# Patient Record
Sex: Female | Born: 1990 | Race: Black or African American | Hispanic: No | Marital: Married | Smoking: Never smoker
Health system: Southern US, Community
[De-identification: ages and names within clinical notes are randomized; demographics above are authoritative.]

## PROBLEM LIST (undated history)

## (undated) DIAGNOSIS — D649 Anemia, unspecified: Secondary | ICD-10-CM

## (undated) HISTORY — PX: NO PAST SURGERIES: SHX2092

---

## 2017-10-10 ENCOUNTER — Ambulatory Visit (INDEPENDENT_AMBULATORY_CARE_PROVIDER_SITE_OTHER): Payer: Medicaid Other

## 2017-10-10 ENCOUNTER — Ambulatory Visit (HOSPITAL_COMMUNITY)
Admission: EM | Admit: 2017-10-10 | Discharge: 2017-10-10 | Disposition: A | Discharge status: Home / Self Care | Payer: Medicaid Other | Attending: Family Medicine | Admitting: Family Medicine

## 2017-10-10 ENCOUNTER — Encounter (HOSPITAL_COMMUNITY): Payer: Self-pay | Admitting: Family Medicine

## 2017-10-10 DIAGNOSIS — T148XXA Other injury of unspecified body region, initial encounter: Secondary | ICD-10-CM | POA: Diagnosis not present

## 2017-10-10 DIAGNOSIS — S50811A Abrasion of right forearm, initial encounter: Secondary | ICD-10-CM

## 2017-10-10 DIAGNOSIS — S80211A Abrasion, right knee, initial encounter: Secondary | ICD-10-CM

## 2017-10-10 DIAGNOSIS — M25521 Pain in right elbow: Secondary | ICD-10-CM | POA: Diagnosis not present

## 2017-10-10 DIAGNOSIS — M25561 Pain in right knee: Secondary | ICD-10-CM | POA: Diagnosis not present
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
## 2017-10-10 NOTE — Discharge Instructions (Signed)
Continue with triple antibiotic ointment twice a day. Cleanse abrasions with soap and water twice a day. Keep clean and dry. Ice application for comfort. Ibuprofen for comfort. If develop increased redness or increased drainage follow up with your Primary Care provider to be rechecked.
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
## 2017-11-12 ENCOUNTER — Inpatient Hospital Stay (HOSPITAL_COMMUNITY)
Admission: AD | Admit: 2017-11-12 | Discharge: 2017-11-12 | Disposition: A | Discharge status: Home / Self Care | Payer: Medicaid Other | Source: Ambulatory Visit | Attending: Obstetrics & Gynecology | Admitting: Obstetrics & Gynecology

## 2017-11-12 ENCOUNTER — Encounter (HOSPITAL_COMMUNITY): Payer: Self-pay

## 2017-11-12 DIAGNOSIS — Z3202 Encounter for pregnancy test, result negative: Secondary | ICD-10-CM | POA: Diagnosis not present

## 2017-11-12 DIAGNOSIS — R109 Unspecified abdominal pain: Secondary | ICD-10-CM | POA: Diagnosis present

## 2017-11-12 HISTORY — DX: Anemia, unspecified: D64.9

## 2017-11-12 LAB — POCT PREGNANCY, URINE: Preg Test, Ur: NEGATIVE

## 2017-11-12 LAB — WET PREP, GENITAL
Trich, Wet Prep: NONE SEEN
YEAST WET PREP: NONE SEEN

## 2017-11-12 LAB — URINALYSIS, ROUTINE W REFLEX MICROSCOPIC
BILIRUBIN URINE: NEGATIVE
Glucose, UA: NEGATIVE mg/dL
Hgb urine dipstick: NEGATIVE
Ketones, ur: NEGATIVE mg/dL
NITRITE: NEGATIVE
PH: 7 (ref 5.0–8.0)
Protein, ur: NEGATIVE mg/dL
SPECIFIC GRAVITY, URINE: 1.024 (ref 1.005–1.030)

## 2017-11-12 LAB — CBC
HCT: 36.8 % (ref 36.0–46.0)
HEMOGLOBIN: 11.6 g/dL — AB (ref 12.0–15.0)
MCH: 26.8 pg (ref 26.0–34.0)
MCHC: 31.5 g/dL (ref 30.0–36.0)
MCV: 85 fL (ref 78.0–100.0)
PLATELETS: 273 10*3/uL (ref 150–400)
RBC: 4.33 MIL/uL (ref 3.87–5.11)
RDW: 13.5 % (ref 11.5–15.5)
WBC: 10.5 10*3/uL (ref 4.0–10.5)

## 2017-11-12 LAB — HCG, SERUM, QUALITATIVE: PREG SERUM: NEGATIVE

## 2017-11-12 NOTE — Discharge Instructions (Signed)
In late 2019, the [facility] will be moving to the [facility] campus. At that time, the MAU (Maternity Admissions Unit), where you are being seen today, will no longer see non-pregnant patients. We strongly encourage you to find a doctor's office before that time, so that you can be seen with any GYN concerns, like vaginal discharge, urinary tract infection, etc.. in a timely manner.   In order to make the office visit more convenient, the [facility] at [facility] will be offering evening hours from 4pm-7:30pm on Monday. There will be same-day appointments, walk-in appointments and scheduled appointments available during this time. We will be adding more evening hours over the next year before the move.   [facility] @ [facility] [phone]  For urgent needs, [facility] is also available for management of urgent GYN complaints such as vaginal discharge or urinary tract infections.      Infertility Infertility is when you are unable to get pregnant (conceive) after a year of having sex regularly without using birth control. Infertility can also mean that a woman is not able to carry a pregnancy to full term. Both women and men can have fertility problems. What causes infertility? What Causes Infertility in Women? There are many possible causes of infertility in women. For some women, the cause of infertility is not known (unexplained infertility). Infertility can also be linked to more than one cause. Infertility problems in women can be caused by problems with the menstrual cycle or reproductive organs, certain medical conditions, and factors related to lifestyle and age.  Problems with your menstrual cycle can interfere with your ovaries producing eggs (ovulation). This can make it difficult to get pregnant. This includes having a menstrual cycle that is very long, very short, or irregular.  Problems with reproductive organs  can include: ? An abnormally narrow cervix or a cervix that does not remain closed during a pregnancy. ? A blockage in your fallopian tubes. ? An abnormally shaped uterus. ? Uterine fibroids. This is a tissue mass (tumor) that can develop on your uterus.  Medical conditions that can affect a woman's fertility include: ? Polycystic ovarian syndrome (PCOS). This is a hormonal disorder that can cause small cysts to grow on your ovaries. This is the most common cause of infertility in women. ? Endometriosis. This is a condition in which the tissue that lines your uterus (endometrium) grows outside of its normal location. ? Primary ovary insufficiency. This is when your ovaries stop producing eggs and hormones before the age of 72. ? Sexually transmitted diseases, such as chlamydia or gonorrhea. These infections can cause scarring in your fallopian tubes. This makes it difficult for eggs to reach your uterus. ? Autoimmune disorders. These are disorders in which your immune system attacks normal, healthy cells. ? Hormone imbalances.  Other factors include: ? Age. A woman's fertility declines with age, especially after her mid-30s. ? Being under- or overweight. ? Drinking too much alcohol. ? Using drugs. ? Exercising excessively. ? Being exposed to environmental toxins, such as radiation, pesticides, and certain chemicals.  What Causes Infertility in Men? There are many causes of infertility in men. Infertility can be linked to more than one cause. Infertility problems in men can be caused by problems with sperm or the reproductive organs, certain medical conditions, and factors related to lifestyle and age. Some men have unexplained infertility.  Problems with sperm. Infertility can result if there is a problem producing: ?  Enough sperm (low sperm count). ? Enough normally-shaped sperm (sperm morphology). ? Sperm that are able to reach the egg (poor motility).  Infertility can also be caused  by: ? A problem with hormones. ? Enlarged veins (varicoceles), cysts (spermatoceles), or tumors of the testicles. ? Sexual dysfunction. ? Injury to the testicles. ? A birth defect, such as not having the tubes that carry sperm (vas deferens).  Medical conditions that can affect a man's fertility include: ? Diabetes. ? Cancer treatments, such as chemotherapy or radiation. ? Klinefelter syndrome. This is an inherited genetic disorder. ? Thyroid problems, such as an under- or overactive thyroid. ? Cystic fibrosis. ? Sexually transmitted diseases.  Other factors include: ? Age. A man's fertility declines with age. ? Drinking too much alcohol. ? Using drugs. ? Being exposed to environmental toxins, such as pesticides and lead.  What are the symptoms of infertility? Being unable to get pregnant after one year of having regular sex without using birth control is the only sign of infertility. How is infertility diagnosed? In order to be diagnosed with infertility, both partners will have a physical exam. Both partners will also have an extensive medical and sexual history taken. If there is no obvious reason for infertility, additional tests may be done. What Tests Will Women Have? Women may first have tests to check whether they are ovulating each month. The tests may include:  Blood tests to check hormone levels.  An ultrasound of the ovaries. This looks for possible problems on or in the ovaries.  Taking a small sample of the tissue that lines the uterus for examination under a microscope (endometrial biopsy).  Women who are ovulating may have additional tests. These may include:  Hysterosalpingography. ? This is an X-ray of the fallopian tubes and uterus taken after a specific type of dye is injected. ? This test can show the shape of the uterus and whether the fallopian tubes are open.  Laparoscopy. ? In this test, a lighted tube (laparoscope) is used to look for problems in the  fallopian tubes and other female organs.  Transvaginal ultrasound. ? This is an imaging test to check for abnormalities of the uterus and ovaries. ? A health care provider can use this test to count the number of follicles on the ovaries.  Hysteroscopy. ? This test involves using a lighted tube to examine the cervix and inside the uterus. ? It is done to find any abnormalities inside the uterus.  What Tests Will Men Have? Tests for mens infertility includes:  Semen tests to check sperm count, morphology, and motility.  Blood tests to check for hormone levels.  Taking a small sample of tissue from inside a testicle (biopsy). This is examined under a microscope.  Blood tests to check for genetic abnormalities (genetic testing).  How are women treated for infertility? Treatment depends on the cause of infertility. Most cases of infertility in women are treated with medicine or surgery.  Women may take medicine to: ? Correct ovulation problems. ? Treat other health conditions, such as PCOS.  Surgery may be done to: ? Repair damage to the ovaries, fallopian tubes, cervix, or uterus. ? Remove growths from the uterus. ? Remove scar tissue from the uterus, pelvis, or other female organs.  How are men treated for infertility? Treatment depends on the cause of infertility. Most cases of infertility in men are treated with medicine or surgery.  Men may take medicine to: ? Correct hormone problems. ? Treat other health conditions. ?  Treat sexual dysfunction.  Surgery may be done to: ? Remove blockages in the reproductive tract. ? Correct other structural problems of the reproductive tract.  What is assisted reproductive technology? Assisted reproductive technology (ART) refers to all treatments and procedures that combine eggs and sperm outside the body to try to help a couple conceive. ART is often combined with fertility drugs to stimulate ovulation. Sometimes ART is done using  eggs retrieved from another woman's body (donor eggs) or from previously frozen fertilized eggs (embryos). There are different types of ART. These include:  Intrauterine insemination (IUI). ? In this procedure, sperm is placed directly into a woman's uterus with a long, thin tube. ? This may be most effective for infertility caused by sperm problems, including low sperm count and low motility. ? Can be used in combination with fertility drugs.  In vitro fertilization (IVF). ? This is often done when a woman's fallopian tubes are blocked or when a man has low sperm counts. ? Fertility drugs stimulate the ovaries to produce multiple eggs. Once mature, these eggs are removed from the body and combined with the sperm to be fertilized. ? These fertilized eggs are then placed in the woman's uterus.  This information is not intended to replace advice given to you by your health care provider. Make sure you discuss any questions you have with your health care provider. Document Released: 12/14/2003 Document Revised: 05/12/2016 Document Reviewed: 08/26/2014 Elsevier Interactive Patient Education  2018 [employer]

## 2017-11-12 NOTE — MAU Note (Signed)
Pt said she had a HPT that's positive, is having abdominal cramping for the last few days but worse today. 5/10. No bleeding

## 2017-11-12 NOTE — MAU Provider Note (Signed)
History     CSN: [id number]  Arrival date and time: 11/12/17 [id number]   First Provider Initiated Contact with Patient 11/12/17 1812      Chief Complaint  Patient presents with  . Abdominal Pain   26 y.o. female here with cramping. Cramping is bilateral lower abdomen. Rates 5/10. Has not used anything for it. LMP was 13 days ago. She reports having a +HPT 2 days ago, she took it because she was eating and sleeping alot. She also reports having an US at the pregnancy care center last week and was told she had a 5 wk pregnancy in her uterus.   Past Medical History:  Diagnosis Date  . Anemia     Past Surgical History:  Procedure Laterality Date  . NO PAST SURGERIES      History reviewed. No pertinent family history.  Social History   Tobacco Use  . Smoking status: Never Smoker  . Smokeless tobacco: Never Used  Substance Use Topics  . Alcohol use: No    Frequency: Never  . Drug use: No    Allergies: No Known Allergies  No medications prior to admission.    Review of Systems  Gastrointestinal: Positive for abdominal pain. Negative for constipation, diarrhea, nausea and vomiting. Abdominal distention: cramping.  Genitourinary: Negative for dysuria, vaginal bleeding and vaginal discharge.   Physical Exam   Blood pressure 125/67, pulse (!) 116, temperature 98.3 F (36.8 C), temperature source Oral, resp. rate 16, last menstrual period 10/30/2017, SpO2 97 %.  Physical Exam  Constitutional: She is oriented to person, place, and time. She appears well-developed and well-nourished.  HENT:  Head: Normocephalic.  Neck: Normal range of motion.  Respiratory: Effort normal. No respiratory distress.  Genitourinary:  Genitourinary Comments: External: no lesions or erythema Vagina: rugated, pink, moist, small thin white discharge Uterus: non enlarged, anteverted, non tender, no CMT Adnexae: no masses, no tenderness left, no tenderness right   Musculoskeletal: Normal range of  motion.  Neurological: She is alert and oriented to person, place, and time.  Skin: Skin is warm and dry.  Psychiatric: She has a normal mood and affect.   Results for orders placed or performed during the hospital encounter of 11/12/17 (from the past 24 hour(s))  Urinalysis, Routine w reflex microscopic     Status: Abnormal   Collection Time: 11/12/17  5:58 PM  Result Value Ref Range   Color, Urine YELLOW YELLOW   APPearance HAZY (A) CLEAR   Specific Gravity, Urine 1.024 1.005 - 1.030   pH 7.0 5.0 - 8.0   Glucose, UA NEGATIVE NEGATIVE mg/dL   Hgb urine dipstick NEGATIVE NEGATIVE   Bilirubin Urine NEGATIVE NEGATIVE   Ketones, ur NEGATIVE NEGATIVE mg/dL   Protein, ur NEGATIVE NEGATIVE mg/dL   Nitrite NEGATIVE NEGATIVE   Leukocytes, UA TRACE (A) NEGATIVE   RBC / HPF 0-5 0 - 5 RBC/hpf   WBC, UA 6-30 0 - 5 WBC/hpf   Bacteria, UA RARE (A) NONE SEEN   Squamous Epithelial / LPF 0-5 (A) NONE SEEN   Mucus PRESENT   Pregnancy, urine POC     Status: None   Collection Time: 11/12/17  6:01 PM  Result Value Ref Range   Preg Test, Ur NEGATIVE NEGATIVE  Wet prep, genital     Status: Abnormal   Collection Time: 11/12/17  6:22 PM  Result Value Ref Range   Yeast Wet Prep HPF POC NONE SEEN NONE SEEN   Trich, Wet Prep NONE SEEN NONE SEEN  Clue Cells Wet Prep HPF POC PRESENT (A) NONE SEEN   WBC, Wet Prep HPF POC FEW (A) NONE SEEN   Sperm PRESENT   CBC     Status: Abnormal   Collection Time: 11/12/17  6:36 PM  Result Value Ref Range   WBC 10.5 4.0 - 10.5 K/uL   RBC 4.33 3.87 - 5.11 MIL/uL   Hemoglobin 11.6 (L) 12.0 - 15.0 g/dL   HCT 16.136.8 09.636.0 - 04.546.0 %   MCV 85.0 78.0 - 100.0 fL   MCH 26.8 26.0 - 34.0 pg   MCHC 31.5 30.0 - 36.0 g/dL   RDW 40.913.5 81.111.5 - 91.415.5 %   Platelets 273 150 - 400 K/uL  hCG, serum, qualitative     Status: None   Collection Time: 11/12/17  6:36 PM  Result Value Ref Range   Preg, Serum NEGATIVE NEGATIVE   MAU Course  Procedures  MDM Labs ordered and reviewed. No  evidence of pregnancy. No evidence of acute abdominal or pelvic infection. Pt reports trying to conceive with the same partner over the last 4 years and been unsuccessful. I recommend she establish care with a GYN for further evaluation and I provided a list. Stable for discharge home.  Assessment and Plan   1. Abdominal cramping   2. Pregnancy examination or test, negative result    Discharge home Follow up with OBGYN provider of choice Return to MAU for OBGYN emergencies  Allergies as of 11/12/2017   No Known Allergies     Medication List    You have not been prescribed any medications.    Donette LarryMelanie Brynlie Daza, CNM 11/12/2017, 7:19 PM

## 2017-11-13 LAB — GC/CHLAMYDIA PROBE AMP (~~LOC~~) NOT AT ARMC
CHLAMYDIA, DNA PROBE: NEGATIVE
NEISSERIA GONORRHEA: NEGATIVE

## 2017-11-29 ENCOUNTER — Encounter: Payer: Self-pay | Admitting: Obstetrics and Gynecology

## 2017-11-29 ENCOUNTER — Other Ambulatory Visit (HOSPITAL_COMMUNITY)
Admission: RE | Admit: 2017-11-29 | Discharge: 2017-11-29 | Disposition: A | Discharge status: Home / Self Care | Payer: Medicaid Other | Source: Ambulatory Visit | Attending: Obstetrics and Gynecology | Admitting: Obstetrics and Gynecology

## 2017-11-29 ENCOUNTER — Ambulatory Visit (INDEPENDENT_AMBULATORY_CARE_PROVIDER_SITE_OTHER): Payer: Medicaid Other | Admitting: Obstetrics and Gynecology

## 2017-11-29 VITALS — BP 117/75 | HR 96 | Ht 61.0 in | Wt 178.0 lb

## 2017-11-29 DIAGNOSIS — Z01419 Encounter for gynecological examination (general) (routine) without abnormal findings: Secondary | ICD-10-CM | POA: Insufficient documentation

## 2017-11-29 DIAGNOSIS — Z0000 Encounter for general adult medical examination without abnormal findings: Secondary | ICD-10-CM | POA: Diagnosis not present

## 2017-11-29 DIAGNOSIS — Z3202 Encounter for pregnancy test, result negative: Secondary | ICD-10-CM

## 2017-11-29 DIAGNOSIS — Z129 Encounter for screening for malignant neoplasm, site unspecified: Secondary | ICD-10-CM

## 2017-11-29 DIAGNOSIS — N898 Other specified noninflammatory disorders of vagina: Secondary | ICD-10-CM

## 2017-11-29 DIAGNOSIS — Z113 Encounter for screening for infections with a predominantly sexual mode of transmission: Secondary | ICD-10-CM

## 2017-11-29 DIAGNOSIS — Z3169 Encounter for other general counseling and advice on procreation: Secondary | ICD-10-CM

## 2017-11-29 LAB — POCT URINALYSIS DIPSTICK
Bilirubin, UA: NEGATIVE
Blood, UA: NEGATIVE
GLUCOSE UA: NEGATIVE
Ketones, UA: NEGATIVE
Leukocytes, UA: NEGATIVE
NITRITE UA: NEGATIVE
PROTEIN UA: NEGATIVE
SPEC GRAV UA: 1.01 (ref 1.010–1.025)
UROBILINOGEN UA: 0.2 U/dL
pH, UA: 5 (ref 5.0–8.0)

## 2017-11-29 LAB — POCT URINE PREGNANCY: PREG TEST UR: NEGATIVE

## 2017-11-29 NOTE — Progress Notes (Signed)
Pt presents for annual, pap, and all STD blood work. She c/o intermittent low abdominal pain "for years." Pt also requests to discuss infertility issues.

## 2017-11-29 NOTE — Progress Notes (Signed)
GYNECOLOGY ANNUAL PREVENTATIVE CARE ENCOUNTER NOTE  Subjective:   Stacy Bailey is a 26 y.o. G17P1001 female here for a annual gynecologic exam. Current complaints: requests STI screening and wishes to discuss fertility.  Also has occasional bumps that are painful and burst, are common when she is changing her pubic hair routine (shaving vs not shaving). Reports monthly periods, but are very irregular, doesn't know when they will start, lasting 5-6 days, heavier at the beginning then lighter. No concern for STIs but desires to be tested. Pain with deep intercourse, resolves with change in position.   She also wants to know why she is not getting pregnant. She has been with her partner, 17, for 4.5 years and has been having regular unprotected intercourse the entire time, has never gotten pregnant by him. Prior to this, she was with another partner for about a year where she would miss her period for a couple of months, then have heavy bleeding for two weeks. Patient assumed she was pregnant and these episodes were miscarriages, she never took a pregnancy test.   Had SVD age 31 with no issues. Denies complications with pregnancy.    Gynecologic History Patient's last menstrual period was 11/27/2017. Contraception: none Last Pap: age 40, reports was normal Last mammogram: n/a  Obstetric History OB History  Gravida Para Term Preterm AB Living  _0 SAB TAB Ectopic Multiple Live Births          1    # Outcome Date GA Lbr Len/2nd Weight Sex Delivery Anes PTL Lv  1 Term 01/21/10 [redacted]w[redacted]d 5 lb 13 oz (2.637 kg) F Vag-Spont EPI  LIV      Past Medical History:  Diagnosis Date  . Anemia     Past Surgical History:  Procedure Laterality Date  . NO PAST SURGERIES      No current outpatient medications on file prior to visit.   No current facility-administered medications on file prior to visit.     No Known Allergies  Social History   Socioeconomic History  .  Marital status: Married    Spouse name: Not on file  . Number of children: Not on file  . Years of education: Not on file  . Highest education level: Not on file  Social Needs  . Financial resource strain: Not on file  . Food insecurity - worry: Not on file  . Food insecurity - inability: Not on file  . Transportation needs - medical: Not on file  . Transportation needs - non-medical: Not on file  Occupational History  . Not on file  Tobacco Use  . Smoking status: Never Smoker  . Smokeless tobacco: Never Used  Substance and Sexual Activity  . Alcohol use: No    Frequency: Never  . Drug use: No  . Sexual activity: Yes    Birth control/protection: None  Other Topics Concern  . Not on file  Social History Narrative  . Not on file    Family History  Problem Relation Age of Onset  . Stroke Father   . Seizures Father   . Breast cancer Maternal Grandmother   . Bone cancer Maternal Grandfather     The following portions of the patient's history were reviewed and updated as appropriate: allergies, current medications, past family history, past medical history, past social history, past surgical history and problem list.  Review of Systems Pertinent items are noted in HPI.   Objective:  BP 117/75   Pulse 96   Ht 5' 1" (1.549 m)   Wt 178 lb (80.7 kg)   LMP 11/27/2017   BMI 33.63 kg/m  CONSTITUTIONAL: Well-developed, well-nourished female in no acute distress.  HENT:  Normocephalic, atraumatic, External right and left ear normal. Oropharynx is clear and moist EYES: Conjunctivae and EOM are normal. Pupils are equal, round, and reactive to light. No scleral icterus.  NECK: Normal range of motion, supple, no masses.  Normal thyroid.  SKIN: Skin is warm and dry. No rash noted. Not diaphoretic. No erythema. No pallor. NEUROLOGIC: Alert and oriented to person, place, and time. Normal reflexes, muscle tone coordination. No cranial nerve deficit noted. PSYCHIATRIC: Normal mood and  affect. Normal behavior. Normal judgment and thought content. CARDIOVASCULAR: Normal heart rate noted, regular rhythm RESPIRATORY: Clear to auscultation bilaterally. Effort and breath sounds normal, no problems with respiration noted. BREASTS: Symmetric in size. No masses, skin changes, nipple drainage, or lymphadenopathy. ABDOMEN: Soft, normal bowel sounds, no distention noted.  No tenderness, rebound or guarding.  PELVIC: Normal appearing external genitalia; normal appearing vaginal mucosa and cervix.  No abnormal discharge noted.  Pap smear obtained.  Normal uterine size, no other palpable masses, no uterine or adnexal tenderness. Small hard bumps with char of ingrown hairs MUSCULOSKELETAL: Normal range of motion. No tenderness.  No cyanosis, clubbing, or edema.  2+ distal pulses.   Assessment and Plan:   1. Encounter for gynecological examination with Papanicolaou smear of cervix - Cytology - PAP - POCT urine pregnancy - POCT urinalysis dipstick  2. Screening examination for STD (sexually transmitted disease) - Hepatitis C antibody - Hepatitis B surface antigen - HIV antibody - RPR  3. Vaginal lesion No herpetic lesions noted on exam, reviewed one lesion that appears to be an ingrown hairs Recommend waxing rather than shvaing - HSV(herpes smplx)abs-1+2(IgG+IgM)-bld  4. Infertility counseling Occasional nipple discharge Irregular monthly periods Patient reports mother stopped having periods in her 2s, had some ovarian issues Sounds like premature ovarian failure but patient unsure - TSH - T4, free - Prolactin - FSH/LH - US PELVIS TRANSVANGINAL NON-OB (TV ONLY); Future - US PELVIS (TRANSABDOMINAL ONLY); Future - CBC  5. Well woman exam - Cytology - PAP  6. Encounter for cancer screening Positive at risk screening, MGM with ovarian cancer [facility] panel sent   Will follow up results of pap smear/STI screen and manage accordingly.   Routine preventative health  maintenance measures emphasized. Please refer to After Visit Summary for other counseling recommendations.    Feliz Beam, M.D. Attending [place], [facility] for [employer], [place]

## 2017-12-04 LAB — CYTOLOGY - PAP
BACTERIAL VAGINITIS: POSITIVE — AB
CANDIDA VAGINITIS: POSITIVE — AB
CHLAMYDIA, DNA PROBE: NEGATIVE
Diagnosis: NEGATIVE
NEISSERIA GONORRHEA: NEGATIVE
TRICH (WINDOWPATH): NEGATIVE

## 2017-12-05 LAB — CBC
HEMATOCRIT: 37.3 % (ref 34.0–46.6)
HEMOGLOBIN: 11.7 g/dL (ref 11.1–15.9)
MCH: 26.2 pg — ABNORMAL LOW (ref 26.6–33.0)
MCHC: 31.4 g/dL — ABNORMAL LOW (ref 31.5–35.7)
MCV: 83 fL (ref 79–97)
Platelets: 341 10*3/uL (ref 150–379)
RBC: 4.47 x10E6/uL (ref 3.77–5.28)
RDW: 13.7 % (ref 12.3–15.4)
WBC: 8.5 10*3/uL (ref 3.4–10.8)

## 2017-12-05 LAB — FSH/LH
FSH: 5.6 m[IU]/mL
LH: 6.9 m[IU]/mL

## 2017-12-05 LAB — RPR: RPR: NONREACTIVE

## 2017-12-05 LAB — HEPATITIS C ANTIBODY: Hep C Virus Ab: <0.1 s/co ratio (ref 0.0–0.9)

## 2017-12-05 LAB — HSV(HERPES SMPLX)ABS-I+II(IGG+IGM)-BLD
HSV 1 GLYCOPROTEIN G AB, IGG: 13.1 {index} — AB (ref 0.00–0.90)
HSV 2 IgG, Type Spec: <0.91 index (ref 0.00–0.90)

## 2017-12-05 LAB — T4, FREE: FREE T4: 0.92 ng/dL (ref 0.82–1.77)

## 2017-12-05 LAB — PROLACTIN: Prolactin: 84.7 ng/mL — ABNORMAL HIGH (ref 4.8–23.3)

## 2017-12-05 LAB — TSH: TSH: 1.61 u[IU]/mL (ref 0.450–4.500)

## 2017-12-05 LAB — HIV ANTIBODY (ROUTINE TESTING W REFLEX): HIV Screen 4th Generation wRfx: NONREACTIVE

## 2017-12-05 LAB — HEPATITIS B SURFACE ANTIGEN: HEP B S AG: NEGATIVE

## 2017-12-06 ENCOUNTER — Ambulatory Visit (HOSPITAL_COMMUNITY): Admission: RE | Admit: 2017-12-06 | Payer: Medicaid Other | Source: Ambulatory Visit

## 2017-12-10 ENCOUNTER — Ambulatory Visit (HOSPITAL_COMMUNITY): Admission: RE | Admit: 2017-12-10 | Payer: Medicaid Other | Source: Ambulatory Visit

## 2017-12-11 ENCOUNTER — Telehealth: Payer: Self-pay | Admitting: Pediatrics

## 2017-12-11 NOTE — Telephone Encounter (Signed)
Stacy Bailey left message requesting signature from Dr. Earlene Plateravis for genetic testing order.    I called and left [name] a message requesting she refax the form she needed signed.  I can not locate the form.

## 2017-12-12 NOTE — Telephone Encounter (Signed)
Form is on Dr. Earlene Plateravis' desk for signature.

## 2017-12-14 NOTE — Telephone Encounter (Signed)
Forms signed, faxed, and sent to scan.

## 2017-12-19 ENCOUNTER — Encounter: Payer: Self-pay | Admitting: *Deleted

## 2017-12-26 ENCOUNTER — Telehealth: Payer: Self-pay

## 2017-12-26 MED ORDER — FLUCONAZOLE 150 MG PO TABS: 150.0000 mg | ORAL_TABLET | Freq: Once | ORAL | 0 refills | Status: AC

## 2017-12-26 MED ORDER — METRONIDAZOLE 500 MG PO TABS: 500.0000 mg | ORAL_TABLET | Freq: Two times a day (BID) | ORAL | 0 refills | Status: AC

## 2017-12-26 NOTE — Telephone Encounter (Signed)
Returned call and pt stated that she never received rx for bv and yeast, verified with provider, and sent rx.

## 2017-12-27 ENCOUNTER — Telehealth: Payer: Self-pay

## 2017-12-27 NOTE — Telephone Encounter (Signed)
Called pt and she declined cancer screening labs.

## 2018-01-04 ENCOUNTER — Telehealth: Payer: Self-pay | Admitting: Obstetrics and Gynecology

## 2018-01-04 NOTE — Telephone Encounter (Signed)
Attempted to reach patient x three times regarding her [facility] screening.  I wanted to confirm with her that she wanted to not go forward with the testing.  She consented to test but cancelled when she thought there would be a large expense.  This was due to a miscommunication between [facility] and the patient.  I was trying to reach her to reinforce there would be no out of pocket expense to her.  She disconnected the phone with me x 3.   This could be rediscussed at her next visit.
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
